# Patient Record
Sex: Male | Born: 1960 | Race: White | Hispanic: No
Health system: Midwestern US, Academic
[De-identification: ages and names within clinical notes are randomized; demographics above are authoritative.]

---

## 2016-11-28 ENCOUNTER — Encounter: Admit: 2016-11-28 | Discharge: 2016-11-29

## 2016-11-28 ENCOUNTER — Encounter: Admit: 2016-11-28 | Discharge: 2016-11-28

## 2016-11-28 DIAGNOSIS — S68119A Complete traumatic metacarpophalangeal amputation of unspecified finger, initial encounter: ICD-10-CM

## 2016-11-28 MED ORDER — ENOXAPARIN 40 MG/0.4 ML SC SYRG
40 mg | Freq: Every day | SUBCUTANEOUS | 0 refills | Status: DC
Start: 2016-11-28 — End: 2016-11-30

## 2016-11-28 MED ORDER — CEFAZOLIN INJ 1GM IVP
2 g | INTRAVENOUS | 0 refills | Status: DC
Start: 2016-11-28 — End: 2016-11-29

## 2016-11-28 MED ORDER — OXYCODONE 5 MG PO TAB
5-15 mg | ORAL | 0 refills | Status: DC | PRN
Start: 2016-11-28 — End: 2016-11-30
  Administered 2016-11-29 (×3): 15 mg via ORAL
  Administered 2016-11-29: 21:00:00 5 mg via ORAL
  Administered 2016-11-29: 12:00:00 15 mg via ORAL

## 2016-11-28 MED ORDER — FENTANYL CITRATE (PF) 50 MCG/ML IJ SOLN
25-50 ug | INTRAVENOUS | 0 refills | Status: DC | PRN
Start: 2016-11-28 — End: 2016-11-30
  Administered 2016-11-29: 04:00:00 50 ug via INTRAVENOUS

## 2016-11-28 MED ORDER — HYDRALAZINE 20 MG/ML IJ SOLN
5 mg | INTRAVENOUS | 0 refills | Status: DC | PRN
Start: 2016-11-28 — End: 2016-11-30
  Administered 2016-11-29: 17:00:00 5 mg via INTRAVENOUS

## 2016-11-28 MED ORDER — MAGNESIUM HYDROXIDE 2,400 MG/10 ML PO SUSP
10 mL | Freq: Every day | ORAL | 0 refills | Status: DC
Start: 2016-11-28 — End: 2016-11-30

## 2016-11-28 MED ORDER — BISACODYL 10 MG RE SUPP
10 mg | Freq: Every day | RECTAL | 0 refills | Status: DC | PRN
Start: 2016-11-28 — End: 2016-11-30

## 2016-11-28 MED ORDER — ONDANSETRON HCL (PF) 4 MG/2 ML IJ SOLN
4 mg | INTRAVENOUS | 0 refills | Status: DC | PRN
Start: 2016-11-28 — End: 2016-11-30

## 2016-11-28 MED ORDER — CLINDAMYCIN IN 5 % DEXTROSE 600 MG/50 ML IV PGBK
600 mg | INTRAVENOUS | 0 refills | Status: DC
Start: 2016-11-28 — End: 2016-11-30
  Administered 2016-11-29 (×2): 600 mg via INTRAVENOUS

## 2016-11-28 NOTE — Progress Notes
Working w a Skil saw, amputated through middle phalynx of 5th digit of (R) hand; bleeding is controlled; NV intact on remaining portion of digit. Morphine 4 mg given; tetanus UTD.  Amputated digit is in bag on ice

## 2016-11-29 ENCOUNTER — Encounter: Admit: 2016-11-29 | Discharge: 2016-11-29

## 2016-11-29 ENCOUNTER — Inpatient Hospital Stay
Admit: 2016-11-29 | Discharge: 2016-11-29 | Disposition: A | Discharge status: Home / Self Care | Payer: MEDICARE | Source: Other Acute Inpatient Hospital

## 2016-11-29 ENCOUNTER — Inpatient Hospital Stay: Admit: 2016-11-29 | Discharge: 2016-11-29

## 2016-11-29 DIAGNOSIS — Z880 Allergy status to penicillin: ICD-10-CM

## 2016-11-29 DIAGNOSIS — S68626A Partial traumatic transphalangeal amputation of right little finger, initial encounter: Principal | ICD-10-CM

## 2016-11-29 DIAGNOSIS — M25541 Pain in joints of right hand: ICD-10-CM

## 2016-11-29 DIAGNOSIS — F17200 Nicotine dependence, unspecified, uncomplicated: ICD-10-CM

## 2016-11-29 LAB — CBC AND DIFF
Lab: 0.1 10*3/uL (ref <150)
Lab: 0.2 10*3/uL (ref >50)
Lab: 0.8 10*3/uL (ref <200)
Lab: 1 % (ref 0–2)
Lab: 1.7 10*3/uL (ref 1.0–4.8)
Lab: 3 % (ref 0–5)
Lab: 6.9 10*3/uL (ref 1.8–7.0)
Lab: 9 % (ref 4–12)
Lab: 9.8 K/UL (ref 4.5–11.0)

## 2016-11-29 LAB — BASIC METABOLIC PANEL
Lab: 1 mg/dL (ref 0.4–1.24)
Lab: 103 MMOL/L (ref 98–110)
Lab: 139 MMOL/L (ref 137–147)
Lab: 19 mg/dL (ref 7–25)
Lab: 29 MMOL/L (ref 21–30)
Lab: 4.4 MMOL/L (ref 3.5–5.1)
Lab: 7 pg (ref 3–12)
Lab: 9.4 mg/dL (ref 8.5–10.6)
Lab: 98 mg/dL (ref 70–100)
Lab: >60 mL/min (ref >60)
Lab: >60 mL/min — ABNORMAL LOW (ref >60)

## 2016-11-29 MED ORDER — ONDANSETRON HCL (PF) 4 MG/2 ML IJ SOLN
4 mg | Freq: Once | INTRAVENOUS | 0 refills | Status: DC | PRN
Start: 2016-11-29 — End: 2016-11-29

## 2016-11-29 MED ORDER — DEXAMETHASONE SODIUM PHOSPHATE 4 MG/ML IJ SOLN
INTRAVENOUS | 0 refills | Status: DC
Start: 2016-11-29 — End: 2016-11-29
  Administered 2016-11-29: 14:00:00 4 mg via INTRAVENOUS

## 2016-11-29 MED ORDER — LACTATED RINGERS IV SOLP
1000 mL | INTRAVENOUS | 0 refills | Status: DC
Start: 2016-11-29 — End: 2016-11-30

## 2016-11-29 MED ORDER — OXYCODONE 5 MG PO TAB
5-10 mg | Freq: Once | ORAL | 0 refills | Status: DC | PRN
Start: 2016-11-29 — End: 2016-11-29

## 2016-11-29 MED ORDER — FENTANYL CITRATE (PF) 50 MCG/ML IJ SOLN
50 ug | Freq: Once | INTRAVENOUS | 0 refills | Status: CP
Start: 2016-11-29 — End: ?
  Administered 2016-11-29: 14:00:00 50 ug via INTRAVENOUS

## 2016-11-29 MED ORDER — FENTANYL CITRATE (PF) 50 MCG/ML IJ SOLN
50 ug | INTRAVENOUS | 0 refills | Status: DC | PRN
Start: 2016-11-29 — End: 2016-11-29

## 2016-11-29 MED ORDER — SENNOSIDES-DOCUSATE SODIUM 8.6-50 MG PO TAB
1 | ORAL_TABLET | Freq: Every day | ORAL | 0 refills | Status: AC
Start: 2016-11-29 — End: ?

## 2016-11-29 MED ORDER — DEXTRAN 70-HYPROMELLOSE (PF) 0.1-0.3 % OP DPET
0 refills | Status: DC
Start: 2016-11-29 — End: 2016-11-29
  Administered 2016-11-29: 14:00:00 2 [drp] via OPHTHALMIC

## 2016-11-29 MED ORDER — LABETALOL 5 MG/ML IV SYRG
10 mg | Freq: Once | INTRAVENOUS | 0 refills | Status: CP
Start: 2016-11-29 — End: ?
  Administered 2016-11-29: 17:00:00 10 mg via INTRAVENOUS

## 2016-11-29 MED ORDER — ROPIVACAINE (PF) 5 MG/ML (0.5 %) IJ SOLN
0 refills | Status: DC
Start: 2016-11-29 — End: 2016-11-29
  Administered 2016-11-29: 16:00:00 4 mL

## 2016-11-29 MED ORDER — LIDOCAINE (PF) 200 MG/10 ML (2 %) IJ SYRG
0 refills | Status: DC
Start: 2016-11-29 — End: 2016-11-29
  Administered 2016-11-29: 14:00:00 80 mg via INTRAVENOUS

## 2016-11-29 MED ORDER — FENTANYL CITRATE (PF) 50 MCG/ML IJ SOLN
25 ug | INTRAVENOUS | 0 refills | Status: DC | PRN
Start: 2016-11-29 — End: 2016-11-29

## 2016-11-29 MED ORDER — PROPOFOL INJ 10 MG/ML IV VIAL
0 refills | Status: DC
Start: 2016-11-29 — End: 2016-11-29
  Administered 2016-11-29: 14:00:00 150 mg via INTRAVENOUS

## 2016-11-29 MED ORDER — MIDAZOLAM 1 MG/ML IJ SOLN
INTRAVENOUS | 0 refills | Status: DC
Start: 2016-11-29 — End: 2016-11-29
  Administered 2016-11-29: 14:00:00 2 mg via INTRAVENOUS

## 2016-11-29 MED ORDER — ONDANSETRON HCL (PF) 4 MG/2 ML IJ SOLN
INTRAVENOUS | 0 refills | Status: DC
Start: 2016-11-29 — End: 2016-11-29
  Administered 2016-11-29: 15:00:00 4 mg via INTRAVENOUS

## 2016-11-29 MED ORDER — OXYCODONE 5 MG PO TAB
5 mg | ORAL_TABLET | ORAL | 0 refills | 6.00000 days | Status: AC | PRN
Start: 2016-11-29 — End: ?
  Filled 2016-11-29 (×2): qty 80, 14d supply, fill #1

## 2016-11-29 MED ORDER — FENTANYL CITRATE (PF) 50 MCG/ML IJ SOLN
0 refills | Status: DC
Start: 2016-11-29 — End: 2016-11-29

## 2016-11-29 MED ORDER — CEFAZOLIN 1 GRAM IJ SOLR
0 refills | Status: DC
Start: 2016-11-29 — End: 2016-11-29
  Administered 2016-11-29: 14:00:00 2 g via INTRAVENOUS

## 2016-11-29 MED ORDER — SULFAMETHOXAZOLE-TRIMETHOPRIM 800-160 MG PO TAB
1 | ORAL_TABLET | Freq: Two times a day (BID) | ORAL | 0 refills | Status: AC
Start: 2016-11-29 — End: ?
  Filled 2016-11-29 (×2): qty 10, 5d supply, fill #1

## 2016-11-29 MED ORDER — OXYCODONE 5 MG PO TAB
5 mg | Freq: Once | ORAL | 0 refills | Status: DC
Start: 2016-11-29 — End: 2016-11-30

## 2016-11-29 MED ORDER — HYDROMORPHONE 2 MG/ML IJ SYRG
.5-1 mg | INTRAVENOUS | 0 refills | Status: DC | PRN
Start: 2016-11-29 — End: 2016-11-29

## 2016-11-29 MED ORDER — ACETAMINOPHEN 325 MG PO TAB
650 mg | Freq: Once | ORAL | 0 refills | Status: CP
Start: 2016-11-29 — End: ?
  Administered 2016-11-29: 14:00:00 650 mg via ORAL

## 2016-11-29 MED ORDER — LIDOCAINE (PF) 10 MG/ML (1 %) IJ SOLN
.1-2 mL | INTRAMUSCULAR | 0 refills | Status: DC | PRN
Start: 2016-11-29 — End: 2016-11-29

## 2016-11-29 MED ORDER — LACTATED RINGERS IV SOLP
INTRAVENOUS | 0 refills | Status: DC
Start: 2016-11-29 — End: 2016-11-29

## 2016-11-29 MED ORDER — HYDRALAZINE 20 MG/ML IJ SOLN
5 mg | Freq: Once | INTRAVENOUS | 0 refills | Status: DC
Start: 2016-11-29 — End: 2016-11-29

## 2016-11-29 MED ADMIN — LACTATED RINGERS IV SOLP [4318]: 1000 mL | INTRAVENOUS | @ 13:00:00 | Stop: 2016-11-29 | NDC 00338011704

## 2016-11-29 NOTE — Progress Notes
Brief Ortho Note    56 y.o. 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  Please keep NPO.    [employer]  [phone]

## 2016-11-29 NOTE — Other
Brief Operative Note    Name: [name] is a 56 y.o. male     DOB: [birth year]             MRN#: [id number]  DATE OF OPERATION: 11/29/2016    Date:  11/29/2016        Preoperative Dx:   Traumatic amputation of finger without complication, initial encounter [S68.119A]    Post-op Diagnosis      * Traumatic amputation of finger without complication, initial encounter [S68.119A]    Procedure(s) (LRB):  Revision amputation of right small finger (Right)    Anesthesia Type: Defer to Anesthesia    Surgeon(s) and Role:     Farrel Conners* Omolara Carol J, MD - Resident - Assisting     * Dorathy DaftFalcon, Spencer, MD - Resident - Assisting     * Heddings, Revonda StandardArchie A, MD - Primary      Findings:  Traumatic partial amputation of right small finger s/p revision amputation    Estimated Blood Loss: No blood loss documented.     Specimen(s) Removed/Disposition: * No specimens in log *    Complications:  None    Implants: None    Drains: None    Disposition:  PACU - stable    Farrel ConnersMatthew J Chalee Hirota, MD  Pager [phone]

## 2016-11-29 NOTE — Anesthesia Pre-Procedure Evaluation
Anesthesia Pre-Procedure Evaluation    Name: Jesse Tran      MRN: [id number]     DOB: 10-Apr-1960     Age: 56 y.o.     Sex: male   __________________________________________________________________________     Procedure Date: 11/29/2016   Procedure: Procedure(s):  Revision amputation of right small finger     Physical Assessment  Vital Signs (last filed in past 24 hours):  BP: 179/107 (11/08 0616)  Temp: 36.3 ???C (97.3 ???F) (11/08 4540)  Pulse: 73 (11/08 0616)  Respirations: 16 PER MINUTE (11/08 0616)  SpO2: 95 % (11/08 0616)  O2 Delivery: None (Room Air) (11/08 9811)  Height: 170.2 cm (67) (11/07 1945)  Weight: 68 kg (150 lb) (11/07 1945)      Patient History  Allergies   Allergen Reactions   ??? Hydrocodone RASH and ITCHING   ??? Penicillins RASH and ITCHING        Current Medications    Not on File         Review of Systems/Medical History      Patient summary reviewed  Pertinent labs reviewed    No history of anesthetic complications  No family history of anesthetic complications      Airway - negative        Pulmonary       Current smoker; patient smoked on day of surgery        Cardiovascular         Hypertension,       Use to take lisinopril 4 years ago; then patient stopped taking it      GI/Hepatic/Renal - negative        Neuro/Psych         CVA      Musculoskeletal         Arthritis      Endocrine/Other - negative     Physical Exam    Airway Findings      Mallampati: IV      TM distance: >3 FB      Neck ROM: full      Mouth opening: good      Airway patency: adequate    Dental Findings: Negative        Comments: Missing teeth    Cardiovascular Findings: Negative      Rhythm: regular      Rate: normal    Pulmonary Findings: Negative      Abdominal Findings: Negative      Neurological Findings: Negative         Diagnostic Tests  Hematology:   Lab Results   Component Value Date    HGB 13.8 11/29/2016    HCT 41.0 11/29/2016    PLTCT 288 11/29/2016    WBC 9.8 11/29/2016    NEUT 70 11/29/2016 ANC 6.90 11/29/2016    ALC 1.70 11/29/2016    MONA 9 11/29/2016    AMC 0.80 11/29/2016    EOSA 3 11/29/2016    ABC 0.10 11/29/2016    MCV 86.6 11/29/2016    MCH 29.2 11/29/2016    MCHC 33.7 11/29/2016    MPV 7.1 11/29/2016    RDW 14.2 11/29/2016         General Chemistry:   Lab Results   Component Value Date    NA 139 11/29/2016    K 4.4 11/29/2016    CL 103 11/29/2016    CO2 29 11/29/2016    GAP 7 11/29/2016    BUN 19 11/29/2016  CR 1.05 11/29/2016    GLU 98 11/29/2016    CA 9.4 11/29/2016      Coagulation: No results found for: PT, PTT, INR      Anesthesia Plan    ASA score: 2   Plan: general  Induction method: intravenous  NPO status: acceptable      Informed Consent    Blood Consent: consented      Plan discussed with: anesthesiologist.

## 2016-11-29 NOTE — Patient Education
This RN initiated education binder and left at patient's bedside for review with day RN.

## 2016-11-29 NOTE — Progress Notes
1133 - Report received from Ronnell FreshwaterAdnan Rodriguez Rivera, RN in PACU.

## 2016-11-29 NOTE — Anesthesia Post-Procedure Evaluation
Post-Anesthesia Evaluation    Name: Jesse JourneyGerald Plack      MRN: [id number]     DOB: November 03, 1960     Age: 56 y.o.     Sex: male   __________________________________________________________________________     Procedure Date: 11/29/2016  Procedure: Procedure(s):  Revision amputation of right small finger      Surgeon: Surgeon(s):  Heddings, Revonda StandardArchie A, MD  Dorathy DaftFalcon, Spencer, MD  Farrel ConnersMackay, Matthew J, MD    Post-Anesthesia Vitals  BP: 173/104 (11/08 1100)  Pulse: 105 (11/08 1115)  Respirations: 19 PER MINUTE (11/08 1115)  SpO2: 99 % (11/08 1115)  O2 Delivery: None (Room Air) (11/08 1115)  SpO2 Pulse: 101 (11/08 1115)      Post Anesthesia Evaluation Note    Evaluation location: pre/post  Patient participation: recovered; patient participated in evaluation  Level of consciousness: alert    Pain score: 3  Pain management: adequate    Hydration: normovolemia  Temperature: 36.0C - 38.4C  Airway patency: adequate    Perioperative Events  Perioperative events:  no       Post-op nausea and vomiting: no PONV    Postoperative Status  Cardiovascular status: hemodynamically stable and hypertensive (Back to baseline on floor which is 170/107.  No symptoms and feels good.  ready to go upstairs)  Respiratory status: spontaneous ventilation        Perioperative Events  Perioperative Event: No  Emergency Case Activation: No

## 2016-11-29 NOTE — Progress Notes
Gale JourneyGerald Selk discharged on 11/29/2016.   Marland Kitchen.  Discharge instructions reviewed with patient.  Valuables returned:   Personal Items / Valuables: None.  Home medications:    .  Functional assessment at discharge complete: Yes .

## 2016-11-29 NOTE — Progress Notes
RT Adult Assessment Note    NAME:Jesse Tran             MRN: [id number]             DOB:Aug 15, 1960          AGE: 56 y.o.  ADMISSION DATE: 11/28/2016             DAYS ADMITTED: LOS: 0 days    RT Treatment Plan:       Protocol Plan: Procedures  PEP Therapy: Place a nursing order for "IS Q1h While Awake" for any of Lung Expansion indicators    Additional Comments:  Impressions of the patient: setting on bed, no complains of pain  Intervention(s)/outcome(s): LE for diminished BS  Patient education that was completed: n/a  Recommendations to the care team: n/a    Vital Signs:  Pulse: Pulse: 75  RR: Respirations: 16 PER MINUTE  SpO2: SpO2: 97 %  O2 Device:  none  Liter Flow:    O2%: O2 Percent: 21 %  Breath Sounds: All Breath Sounds: Decreased  Respiratory Effort:  non labored

## 2016-11-29 NOTE — H&P (View-Only)
[facility] Note        Admission Date: 11/28/2016                                                  Chief Complaint/Reason for Consult: Traumatic amputation of right small finger    Assessment/Plan   Jesse Tran is a 56 y.o. male with a zone 2 traumatic amputation of the right small finger after skill saw accident    Operative Intervention: Plan for revision amputation on 10/29/16.  WB status: Nonweightbearing right upper extremity.  Maintain clean and dry dressings at all times.  Antibiotics/tetanus: Patient has a penicillin allergy.  We will plan to initiate clindamycin 600 mg every 6 for his traumatic amputation and open wound.  Tetanus is up-to-date  Pain Control: Oral and IV  Diet: N.p.o. at midnight        Tiandra S[phone]    During normal business hours, please contact [employer]. At all other times, contact the orthopedic surgery resident on call for any questions or concerns.  ______________________________________________________________________    History of Present Illness: Jesse Tran is a 56 y.o. male.  He sustained a traumatic amputation to his right small digit after an accident while using a skill saw.  He was helping a friend with a household project when this occurred.  He had immediate pain and significant bleeding from the area.  He states that the distal portion of his fingertip flew into the grass and dirt.  He is right-hand dominant and is on disability.  He is a current smoker.    He denies any past medical history  His past surgical history is significant for prior arthroscopic knee and shoulder surgery.      Social History  Current tobacco use  No illicit drug use  No alcohol use    Family history  No pertinent family history  Family history is otherwise noncontributory for presenting problem    Allergies:  Hydrocodone and Penicillins    No current outpatient prescriptions on file as of 11/28/2016.         Review of Systems: Pertinent positive findings: Bleeding from small digit  Pertinent negative findings: Headaches, nausea, vomiting  Otherwise a 10 point review of systems was negative.    Vital Signs:  Last Filed in 24 hours   BP: 174/106 (11/07 2157)  Temp: 36.8 ???C (98.3 ???F) (11/07 2157)  Pulse: 73 (11/07 2157)  Respirations: 17 PER MINUTE (11/07 2157)  SpO2: 97 % (11/07 2157)  O2 Delivery: None (Room Air) (11/07 2157)  Height: 170.2 cm (67) (11/07 1945)     Physical Exam:    Constitutional: No acute distress  Eyes: EOM grossly intact  ENT: Oropharynx/Nasopharynx clear  Respiratory: Unlabored respirations  Cardiovascular: Rate normal  Gastrointestinal: No obvious distention  Skin: There is a traumatic amputation with exposed bone and soft tissue structures.  Musculoskeletal: Right upper extremity examined.  Patient has traumatic amputation in zone 2 at the level just distal to the PIP.  There is no active bleeding.  There is no gross contamination of the wound.  The distal portion of the finger was brought on ice and was grossly temp contaminated with dirt and grass.  Neurologic: Grossly intact    Lab/Radiology/Other Diagnostic Tests:    Radiology: Reviewed    CBC w/Diff   No results found  for: WBC, HGB, HCT, PLTCT        Basic Metabolic Profile   No results found for: NA, K, CL, CO2, GAP, BUN, CR, GLU     Coagulation Studies   No results found for: PT, PTT, INR         Sabino Donovan, PGY-3  [phone]

## 2016-11-29 NOTE — Progress Notes
Patient arrived on unit via EMS. Patient transferred to the bed with assistance. Dr. Ramiro HarvestParikh paged, will await orders.  Oriented to surroundings, call light within reach. Plan of care reviewed.  Will continue to monitor and assess.

## 2016-12-03 ENCOUNTER — Encounter: Admit: 2016-12-03 | Discharge: 2016-12-03

## 2016-12-05 NOTE — Discharge Instructions - Pharmacy
Physician Discharge Summary    Name: Jesse Tran  Medical Record Number: [id number]       Account Number:  [id number]  Date Of Birth:  1960/09/09                       Age:  56 years  Admit date:  11/28/2016                    Discharge date:  11/29/16    Attending Physician:  Windell Norfolk, MD            Service: Malachi Bonds    Physician Summary completed by: Farrel Conners, MD    Reason for hospitalization: Traumatic amputation of right small finger    Significant PMH:   History reviewed. No pertinent past medical history.    Allergies: Hydrocodone and Penicillins    Brief Hospital Course:    The patient was admitted with the following History of Present Illness: Jesse Tran is a 56 y.o. male.  He sustained a traumatic amputation to his right small digit after an accident while using a skill saw.  He was helping a friend with a household project when this occurred.  He had immediate pain and significant bleeding from the area.  He states that the distal portion of his fingertip flew into the grass and dirt.  He is right-hand dominant and is on disability.  He is a current smoker.    Hospital Course:  The patient was taken to the OR on 11/29/16. The patient underwent the procedure listed below. The post-operative course was unremarkable, the patient was discharged home post-operatively.     Admission Physical Exam notable for:    Constitutional: No acute distress  Eyes: EOM grossly intact  ENT: Oropharynx/Nasopharynx clear  Respiratory: Unlabored respirations  Cardiovascular: Rate normal  Gastrointestinal: No obvious distention  Skin: There is a traumatic amputation with exposed bone and soft tissue structures.  Musculoskeletal: Right upper extremity examined.  Patient has traumatic amputation in zone 2 at the level just distal to the PIP.  There is no active bleeding.  There is no gross contamination of the wound.  The distal portion of the finger was brought on ice and was grossly temp contaminated with dirt and grass.  Neurologic: Grossly intact    Admission Lab/Radiology studies notable for:   Radiology: Reviewed    Surgical Procedures:  Completion of traumatic amputation of the small finger.    Condition at Discharge: Stable    Discharge Diagnoses:     Hospital Problems        Active Problems    Finger amputation, no complication          Significant Diagnostic Studies and Procedures:  Noted in brief hospital course.    Consults:  None    Patient Disposition: Home       Patient instructions/medications:      Activity as Tolerated   It is important to keep increasing your activity level after you leave the hospital.  Moving around can help prevent blood clots, lung infection (pneumonia) and other problems.  Gradually increasing the number of times you are up moving around will help you return to your normal activity level more quickly.  Continue to increase the number of times you are up to the chair and walking daily to return to your normal activity level.   - Increase activity as tolerated while maintaining your restrictions until further instructions at your follow  up appointment  - Increase walking as able and avoid sitting/standing/laying in one position for long periods of time  - Perform exercises as instructed by physical therapy 2-3 times a day     Bathing Restrictions   Keep your incision dry.    Until your incision is healed DO NOT submerge/soak your incision site. Avoid swimming and hot tubs. Avoid baths that allow your incision to soak.    Your doctor may give you more instructions at your follow-up appointment.     Driving Restrictions   No driving while taking pain medication.     Restrictions for Right Arm   No lifting with the right upper extremity until your clinic follow up.     Report These Signs and Symptoms   Contact your doctor if you have any of the following symptoms:    *temperature higher than 100 degrees  *uncontrolled pain  *persistenet nausea and/or vomiting *calf pain or tenderness  *unable to urinate  *unable to have a bowel movement  *continued or increased drainage    Ongoing swelling of your surgical leg can occur for 3 to 6 months. Bruising is very common as well but should decrease within a few weeks.    Call 911 if you experience difficulty breathing, chest pain, or severe calf pain or swelling.     Questions About Your Stay   For questions or concerns regarding your hospital stay:    DURING BUSINESS HOURS (8:00 AM - 4:30 PM)  Call the Orthopedic clinic at [phone]    AFTER BUSINESS HOURS AND WEEKENDS  Call [phone] and ask the operator to page the on-call Orthopedic Resident.   Discharging attending physician: HEDDINGS, ARCHIE A [[id number]]      Regular Diet   You have no dietary restriction. Please continue with a healthy balanced diet.    While taking pain medications:  - Drink plenty of fluids (not including alcohol)  - Add extra fiber to your diet  - Continue your stool softeners to help prevent constipation     Incision Care   *Keep dressing clean, dry and intact until follow up appointment  *Until your incision is healed DO NOT submerge/soak your incision site.  *Avoid swimming and hot tubs  *Avoid baths that allow your incision to soak.  *Keep your incision clean. Wash hands before dressing changes. Do not allow dirty hands or pets to touch your incision.  *Do not apply deodorants, powders, creams or lotions to your incision.    Your incision should gradually look better each day.  Notify your doctor if you notice swelling, redness, drainage, an increase in pain, or have a fever greater than 100 degrees     Return Appointment   Please contact Dr. Cherene Julian' nurse at [phone] with concerns or questions regarding wound or medication, and to confirm your appointment in 1-2 weeks.   [facility] Provider HEDDINGS, ARCHIE A [[id number]]      Opioid (Narcotic) Safety Information   OPIOID (NARCOTIC) PAIN MEDICATION SAFETY We care about your comfort, and believe you need opioid medications at this time to treat your pain.  An opioid is a strong pain medication.  It is only available by prescription for moderate to severe pain.  Usually these medications are used for only a short time to treat pain, but sometimes will be prescribed for longer.  Talk with your doctor or nurse about how long they expect you to need this medication.    When used the right way, opioids are safe  and effective medications to treat your pain, even when used for a long time.  Yet, when used in the wrong way, opioids can be dangerous for you or others.  Opioids do not work for everyone.  Most patients do not get full relief of their pain from opioid medication; full relief of your pain may not be possible.     For your safety, we ask you to follow these instructions:    *Only take your opioid medication as prescribed.  If your pain is not controlled with the prescribed dose, or the medication is not lasting long enough, call your doctor.  *Do not break or crush your opioid medication unless your doctor or pharmacist says you can.  With certain medications, this can be dangerous, and may cause death.  *Never share your medications with others, even if they appear to have a good reason.  Never take someone else's pain medication-this is dangerous, and illegal (a crime).  Overdoses and deaths have occurred.  *Keep your opioid medications safe, as you would with cash, in a lock box or similar container.  *Make sure your opioids are going to be secure, especially if you are around children or teens.  *Talk with your doctor or pharmacist before you take other medications.  *Avoid driving, operating machinery, or drinking alcohol while taking opioid pain medication.  This may be unsafe.    Pain medications can cause constipation. Constipation is bowel movements that are less often than normal. Stools often become very hard and difficult to pass. This may lead to stomach pain and bloating. It may also cause pain when trying to use the bathroom. Constipation may be treated with suppositories, laxatives or stool softeners. A diet high in fiber with plenty of fluids helps to maintain regular, soft bowel movements.        Current Discharge Medication List       START taking these medications    Details   oxyCODONE (ROXICODONE, OXY-IR) 5 mg tablet Take one tablet by mouth every 4 hours as needed for pain.  Qty: 80 tablet, Refills: 0    PRESCRIPTION TYPE:  Print      senna/docusate (SENOKOT-S) 8.6/50 mg tablet Take one tablet by mouth daily.  Qty: 40 tablet, Refills: 0    PRESCRIPTION TYPE:  Print  Comments: As needed for constipation while on opioid pain medication          The following medications were removed from your list. This list includes medications discontinued this stay and those removed from your prior med list in our system        trimethoprim/sulfamethoxazole (BACTRIM DS) 160/800 mg tablet                Pending items needing follow up: Gale Journey was instructed to follow up as indicated above.    Lilian Coma, PGY-1  [phone]  12/05/2016      cc:  Primary Care Physician:  Verified  Referring physicians:    Additional provider(s):

## 2017-04-25 ENCOUNTER — Encounter: Admit: 2017-04-25 | Discharge: 2017-04-25

## 2020-01-09 IMAGING — CR UP_EXM
3 series · 3 of 3 positions shown · non-contrast
Comparison: none

[hand]
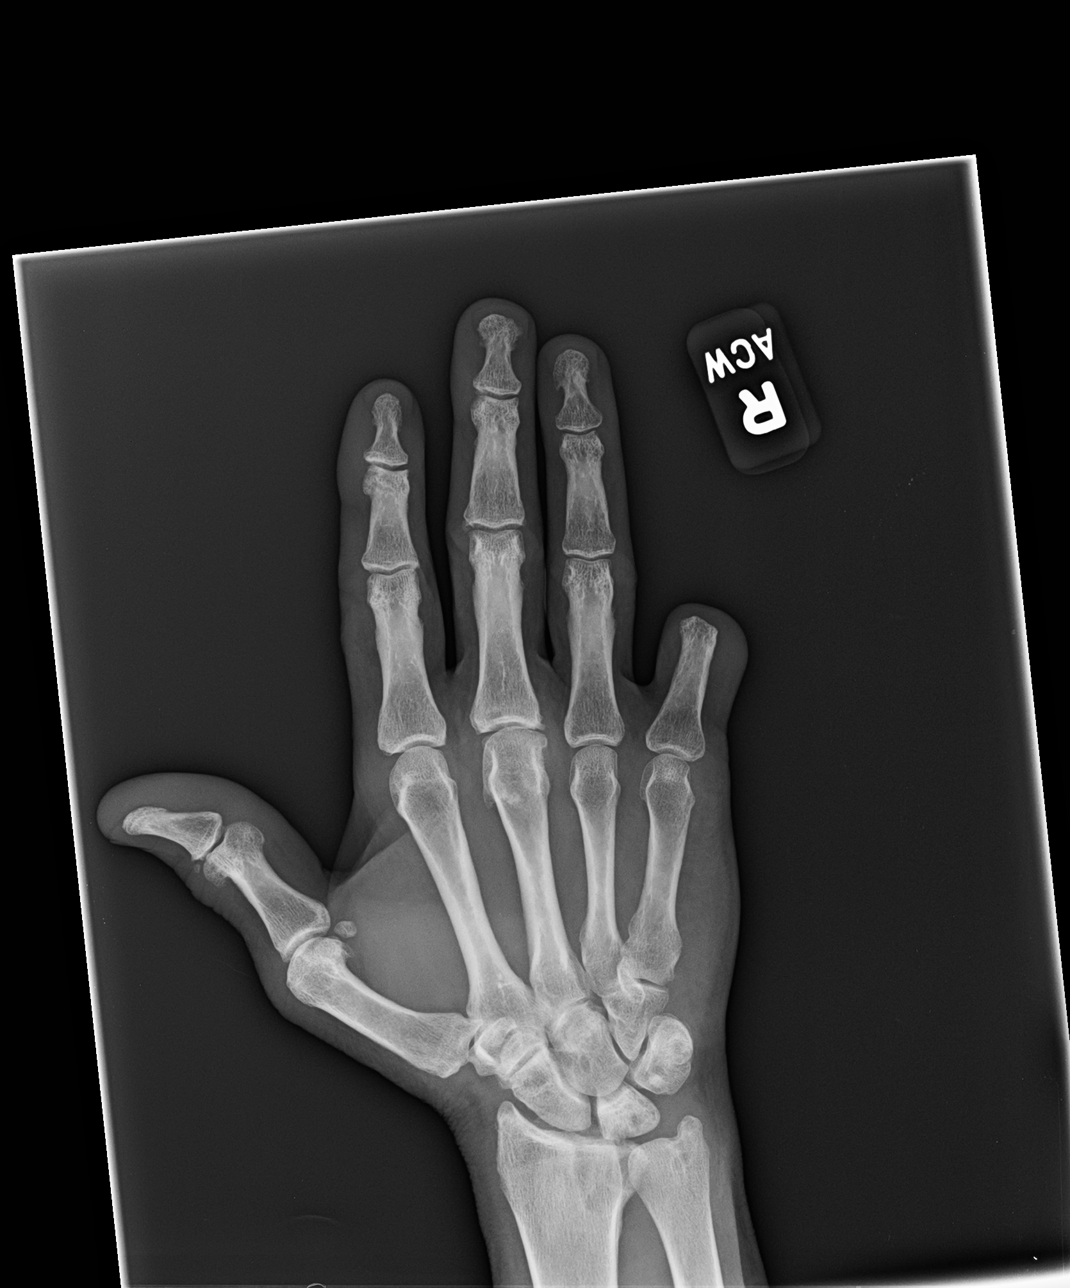

[hand obl]
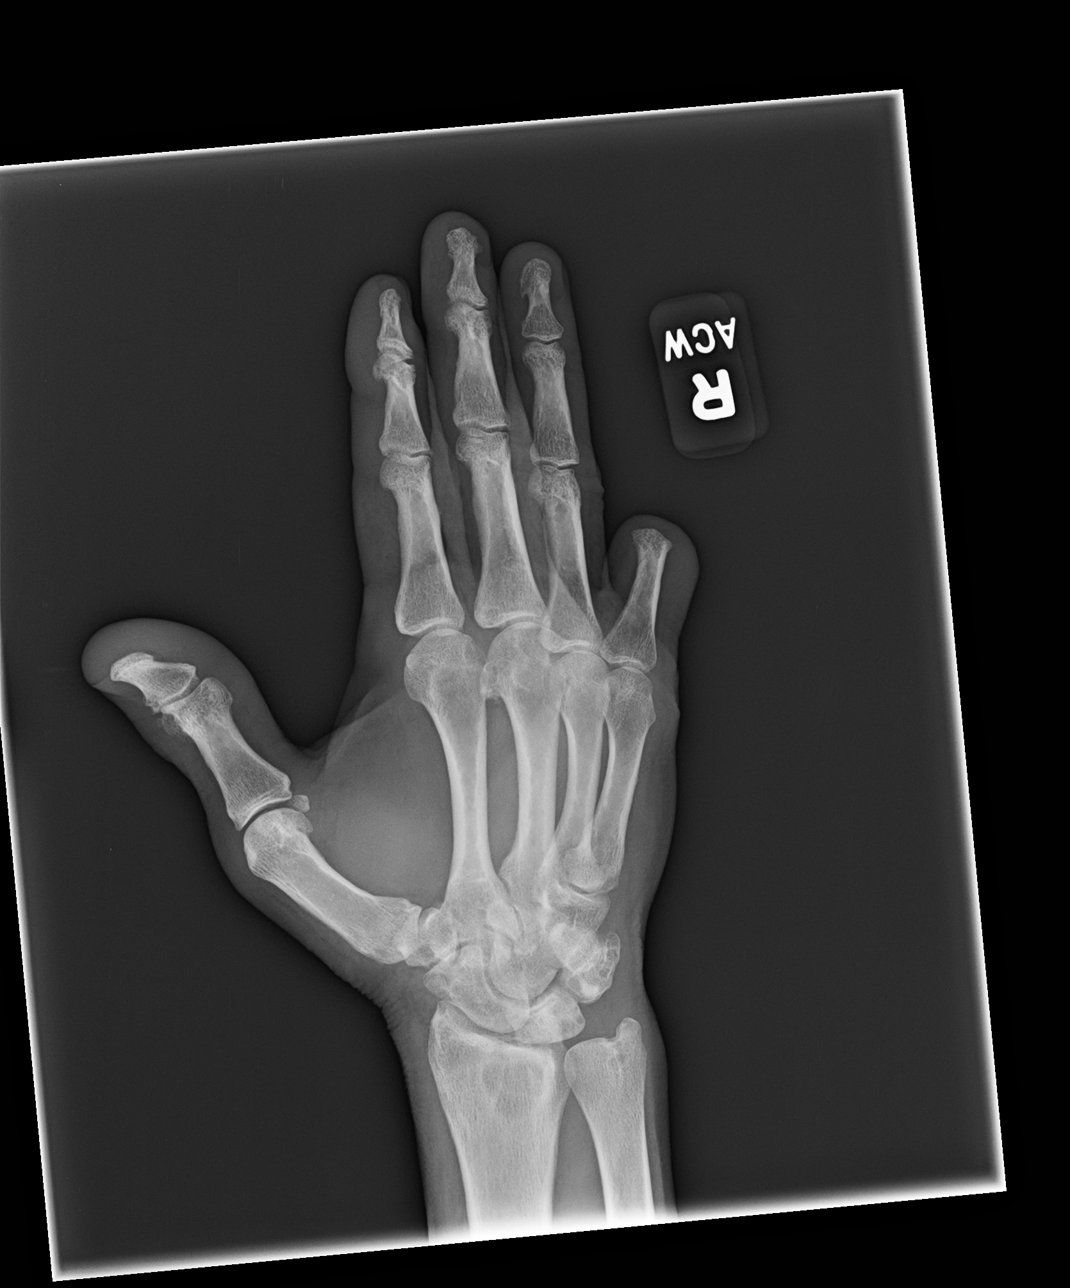

[hand lat]
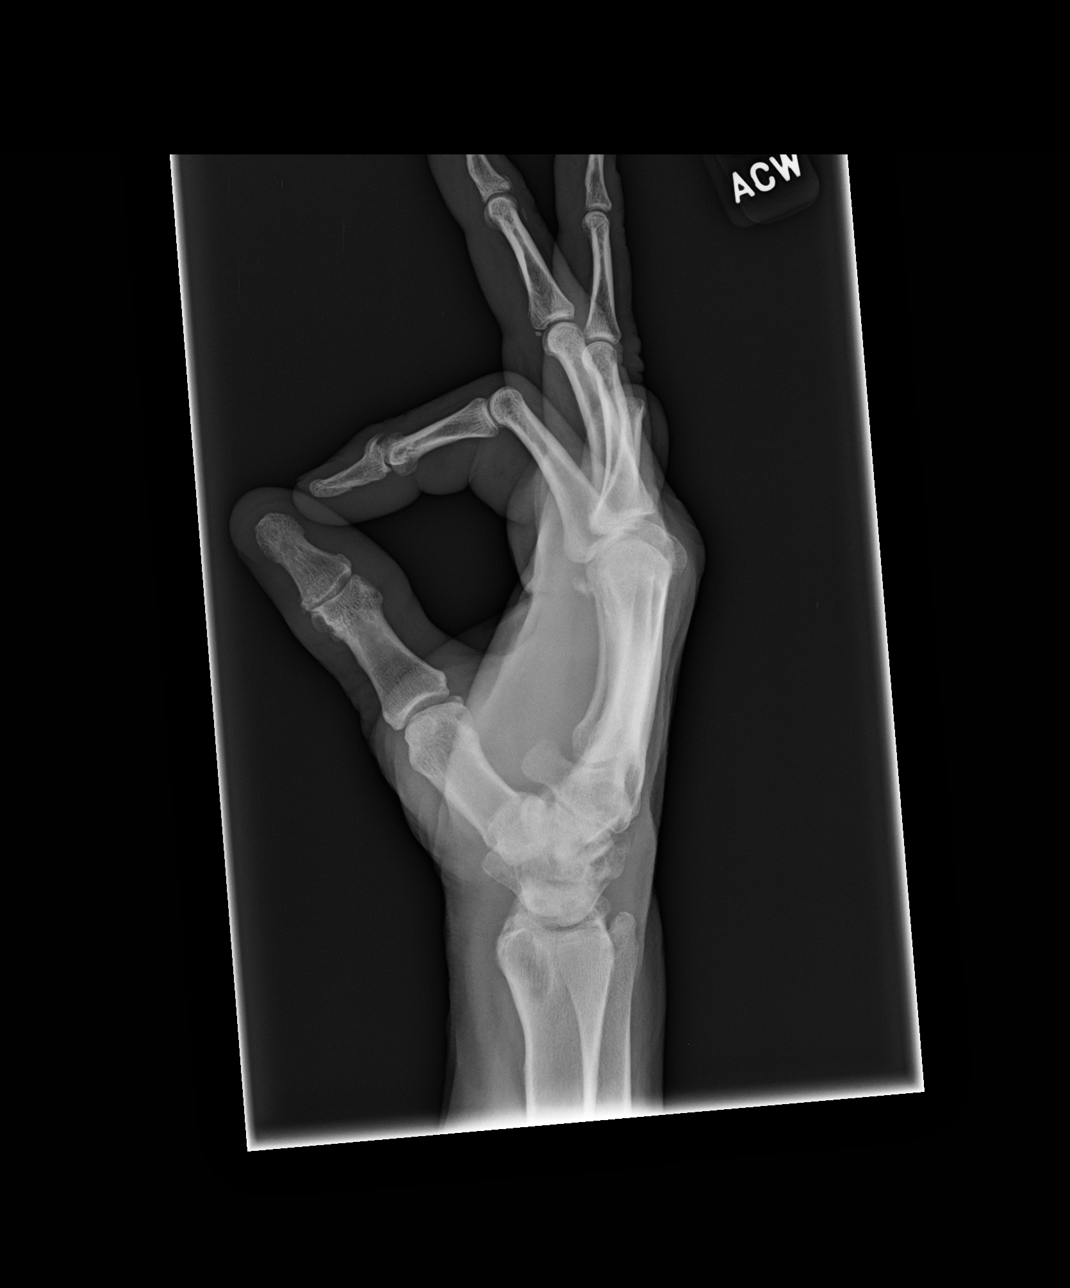

[3 of 3 positions shown; findings below may reference images not displayed]

EXAM
3 VIEW RIGHT HAND

INDICATION
Right hand pain

TECHNIQUE
Frontal, oblique, and lateral radiographs of the right hand were submitted.

COMPARISONS
None

FINDINGS
There is no acute fracture or malalignment. Mild scattered osteoarthritic change with osteophytes.
Findings are greatest at the radiocarpal and long finger metacarpophalangeal joints. No destructive
bony lesion is identified. Little finger has been amputated at the level of the PIP articulation.
Benign cysts in the carpal bones, distal radius, and distal ulna. Findings are probably due to
underlying degenerative cystic change.

IMPRESSION
No acute displaced fracture or malalignment.

Tech Notes:

Pt c/o hand pain x 1 year after 5th digit distal amputation. AW
